# Patient Record
Sex: Male | Born: 1990 | Race: White | Hispanic: No | Marital: Single | Smoking: Never smoker
Health system: Southern US, Community
[De-identification: ages and names within clinical notes are randomized; demographics above are authoritative.]

## PROBLEM LIST (undated history)

## (undated) DIAGNOSIS — R011 Cardiac murmur, unspecified: Secondary | ICD-10-CM

---

## 2012-05-18 ENCOUNTER — Emergency Department (HOSPITAL_COMMUNITY)
Admission: EM | Admit: 2012-05-18 | Discharge: 2012-05-18 | Disposition: A | Discharge status: Home / Self Care | Payer: Self-pay | Attending: Emergency Medicine | Admitting: Emergency Medicine

## 2012-05-18 ENCOUNTER — Encounter (HOSPITAL_COMMUNITY): Payer: Self-pay | Admitting: Emergency Medicine

## 2012-05-18 DIAGNOSIS — R011 Cardiac murmur, unspecified: Secondary | ICD-10-CM | POA: Insufficient documentation

## 2012-05-18 DIAGNOSIS — M533 Sacrococcygeal disorders, not elsewhere classified: Secondary | ICD-10-CM | POA: Insufficient documentation

## 2012-05-18 HISTORY — DX: Cardiac murmur, unspecified: R01.1

## 2012-05-18 MED ORDER — NAPROXEN 500 MG PO TABS: 500.0000 mg | ORAL_TABLET | Freq: Two times a day (BID) | ORAL | Status: AC

## 2012-05-18 MED ORDER — IBUPROFEN 400 MG PO TABS
400.0000 mg | ORAL_TABLET | Freq: Once | ORAL | Status: AC
  Administered 2012-05-18: 400 mg via ORAL
  Filled 2012-05-18: qty 1

## 2012-05-18 NOTE — ED Notes (Signed)
Pt c/o tailbone pain x 2 days; pt denies any obvious injury

## 2012-05-18 NOTE — ED Provider Notes (Signed)
History    This chart was scribed for Ronnie Razor, MD, MD by Smitty Pluck. The patient was seen in room TR09C and the patient's care was started at 4:44PM.   CSN: [id number]  Arrival date & time 05/18/12  1526   First MD Initiated Contact with Patient 05/18/12 1622      Chief Complaint  Patient presents with  . Tailbone Pain    (Consider location/radiation/quality/duration/timing/severity/associated sxs/prior treatment) The history is provided by the patient.   Ronnie Rodriguez is a 21 y.o. male who presents to the Emergency Department complaining of moderate, constant, sharp tailbone pain onset 1 day ago. Pt reports sudden onset when he was walking. Denies any injury. Pt denies radiation. Pt denies incontinence, changes in bowel movements, dizziness, abdominal pain and diarrhea. Pt reports that he has not taken any medication prior to arrival. Pt reports that he has heart murmurs.    Past Medical History  Diagnosis Date  . Heart murmur     History reviewed. No pertinent past surgical history.  History reviewed. No pertinent family history.  History  Substance Use Topics  . Smoking status: Never Smoker   . Smokeless tobacco: Not on file  . Alcohol Use: No      Review of Systems  All other systems reviewed and are negative.   10 Systems reviewed and all are negative for acute change except as noted in the HPI.   Allergies  Review of patient's allergies indicates no known allergies.  Home Medications  No current outpatient prescriptions on file.  BP 136/65  Pulse 79  Temp 98.4 F (36.9 C) (Oral)  Resp 20  SpO2 97%  Physical Exam  Nursing note and vitals reviewed. Constitutional: He appears well-developed and well-nourished. No distress.  HENT:  Head: Normocephalic and atraumatic.  Eyes: Conjunctivae are normal. Right eye exhibits no discharge. Left eye exhibits no discharge.  Neck: Neck supple.  Cardiovascular: Normal rate, regular rhythm and normal heart  sounds.  Exam reveals no gallop and no friction rub.   No murmur heard. Pulmonary/Chest: Effort normal and breath sounds normal. No respiratory distress.  Musculoskeletal: He exhibits no edema.       Tenderness at midline coccyx No overlying skin changes No pylonidal abscess   Neurological: He is alert.  Skin: Skin is warm and dry.  Psychiatric: He has a normal mood and affect. His behavior is normal. Thought content normal.    ED Course  Procedures (including critical care time) DIAGNOSTIC STUDIES: Oxygen Saturation is 97% on room air, normal by my interpretation.    COORDINATION OF CARE: 4:49PM EDP discusses pt ED treatment with pt     Labs Reviewed - No data to display No results found.   1. Coccyx pain       MDM  20yM with atraumatic coccyx pain. No external signs of infection. Consider deep space infection but doubt. No hx of trauma. Nonfocal neuro exam. Low suspicion for emergent etiology at this time. Pan symptomatic tx. Return precautions discussed.  I personally preformed the services scribed in my presence. The recorded information has been reviewed and considered. Ronnie Razor, MD.       Ronnie Razor, MD 05/27/12 [phone]

## 2015-07-22 ENCOUNTER — Inpatient Hospital Stay (HOSPITAL_COMMUNITY)
Admission: EM | Admit: 2015-07-22 | Discharge: 2015-07-28 | DRG: 481 | Disposition: A | Discharge status: Home / Self Care | Payer: Self-pay | Attending: Orthopaedic Surgery | Admitting: Orthopaedic Surgery

## 2015-07-22 DIAGNOSIS — E871 Hypo-osmolality and hyponatremia: Secondary | ICD-10-CM | POA: Diagnosis present

## 2015-07-22 DIAGNOSIS — Z419 Encounter for procedure for purposes other than remedying health state, unspecified: Secondary | ICD-10-CM

## 2015-07-22 DIAGNOSIS — D62 Acute posthemorrhagic anemia: Secondary | ICD-10-CM | POA: Diagnosis not present

## 2015-07-22 DIAGNOSIS — Z01818 Encounter for other preprocedural examination: Secondary | ICD-10-CM

## 2015-07-22 DIAGNOSIS — M7989 Other specified soft tissue disorders: Secondary | ICD-10-CM

## 2015-07-22 DIAGNOSIS — S72001A Fracture of unspecified part of neck of right femur, initial encounter for closed fracture: Secondary | ICD-10-CM | POA: Diagnosis present

## 2015-07-22 DIAGNOSIS — Z880 Allergy status to penicillin: Secondary | ICD-10-CM

## 2015-07-22 DIAGNOSIS — S72141A Displaced intertrochanteric fracture of right femur, initial encounter for closed fracture: Principal | ICD-10-CM | POA: Diagnosis present

## 2015-07-22 DIAGNOSIS — S72009A Fracture of unspecified part of neck of unspecified femur, initial encounter for closed fracture: Secondary | ICD-10-CM | POA: Diagnosis present

## 2015-07-22 DIAGNOSIS — W1830XA Fall on same level, unspecified, initial encounter: Secondary | ICD-10-CM | POA: Diagnosis present

## 2015-07-22 DIAGNOSIS — Z590 Homelessness: Secondary | ICD-10-CM
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
## 2015-07-23 ENCOUNTER — Emergency Department (HOSPITAL_COMMUNITY): Payer: Self-pay

## 2015-07-23 ENCOUNTER — Inpatient Hospital Stay (HOSPITAL_COMMUNITY): Payer: Self-pay | Admitting: Anesthesiology

## 2015-07-23 ENCOUNTER — Inpatient Hospital Stay (HOSPITAL_COMMUNITY): Payer: MEDICAID

## 2015-07-23 ENCOUNTER — Inpatient Hospital Stay (HOSPITAL_COMMUNITY): Payer: MEDICAID | Admitting: Anesthesiology

## 2015-07-23 ENCOUNTER — Inpatient Hospital Stay (HOSPITAL_COMMUNITY): Payer: Self-pay

## 2015-07-23 ENCOUNTER — Encounter (HOSPITAL_COMMUNITY): Payer: Self-pay

## 2015-07-23 ENCOUNTER — Encounter (HOSPITAL_COMMUNITY)
Admission: EM | Disposition: A | Discharge status: Home / Self Care | Payer: Self-pay | Source: Home / Self Care | Attending: Orthopaedic Surgery

## 2015-07-23 DIAGNOSIS — S72001A Fracture of unspecified part of neck of right femur, initial encounter for closed fracture: Secondary | ICD-10-CM | POA: Diagnosis present

## 2015-07-23 DIAGNOSIS — S72009A Fracture of unspecified part of neck of unspecified femur, initial encounter for closed fracture: Secondary | ICD-10-CM | POA: Diagnosis present

## 2015-07-23 HISTORY — PX: FEMUR IM NAIL: SHX1597

## 2015-07-23 LAB — PROTIME-INR
INR: 1.33 (ref 0.00–1.49)
Prothrombin Time: 16.7 seconds — ABNORMAL HIGH (ref 11.6–15.2)

## 2015-07-23 LAB — CBC WITH DIFFERENTIAL/PLATELET
BASOS ABS: 0 10*3/uL (ref 0.0–0.1)
BASOS PCT: 0 %
EOS ABS: 0 10*3/uL (ref 0.0–0.7)
EOS PCT: 1 %
HCT: 40.5 % (ref 39.0–52.0)
Hemoglobin: 13.7 g/dL (ref 13.0–17.0)
LYMPHS PCT: 32 %
Lymphs Abs: 2.2 10*3/uL (ref 0.7–4.0)
MCH: 28.8 pg (ref 26.0–34.0)
MCHC: 33.8 g/dL (ref 30.0–36.0)
MCV: 85.1 fL (ref 78.0–100.0)
Monocytes Absolute: 0.4 10*3/uL (ref 0.1–1.0)
Monocytes Relative: 6 %
Neutro Abs: 4.3 10*3/uL (ref 1.7–7.7)
Neutrophils Relative %: 61 %
PLATELETS: 165 10*3/uL (ref 150–400)
RBC: 4.76 MIL/uL (ref 4.22–5.81)
RDW: 13.1 % (ref 11.5–15.5)
WBC: 6.9 10*3/uL (ref 4.0–10.5)

## 2015-07-23 LAB — COMPREHENSIVE METABOLIC PANEL
ALT: 16 U/L — AB (ref 17–63)
ANION GAP: 9 (ref 5–15)
AST: 26 U/L (ref 15–41)
Albumin: 4.2 g/dL (ref 3.5–5.0)
Alkaline Phosphatase: 61 U/L (ref 38–126)
BUN: 9 mg/dL (ref 6–20)
CHLORIDE: 104 mmol/L (ref 101–111)
CO2: 26 mmol/L (ref 22–32)
Calcium: 8.9 mg/dL (ref 8.9–10.3)
Creatinine, Ser: 1.04 mg/dL (ref 0.61–1.24)
GFR calc non Af Amer: >60 mL/min (ref >60)
Glucose, Bld: 112 mg/dL — ABNORMAL HIGH (ref 65–99)
Potassium: 3.5 mmol/L (ref 3.5–5.1)
SODIUM: 139 mmol/L (ref 135–145)
Total Bilirubin: 1.1 mg/dL (ref 0.3–1.2)
Total Protein: 6.8 g/dL (ref 6.5–8.1)

## 2015-07-23 LAB — SURGICAL PCR SCREEN
MRSA, PCR: NEGATIVE
Staphylococcus aureus: NEGATIVE

## 2015-07-23 LAB — ABO/RH: ABO/RH(D): O POS

## 2015-07-23 LAB — APTT: aPTT: 29 seconds (ref 24–37)

## 2015-07-23 SURGERY — INSERTION, INTRAMEDULLARY ROD, FEMUR
Anesthesia: General | Site: Hip | Laterality: Right

## 2015-07-23 MED ORDER — HYDROCODONE-ACETAMINOPHEN 5-325 MG PO TABS
1.0000 | ORAL_TABLET | Freq: Four times a day (QID) | ORAL | Status: DC | PRN
  Administered 2015-07-23 – 2015-07-27 (×6): 2 via ORAL
  Filled 2015-07-23 (×4): qty 2

## 2015-07-23 MED ORDER — LIDOCAINE HCL (CARDIAC) 20 MG/ML IV SOLN
INTRAVENOUS | Status: DC | PRN
Start: 2015-07-23 — End: 2015-07-23
  Administered 2015-07-23: 80 mg via INTRAVENOUS

## 2015-07-23 MED ORDER — OXYCODONE HCL 5 MG/5ML PO SOLN: 5.0000 mg | Freq: Once | ORAL | Status: AC | PRN

## 2015-07-23 MED ORDER — SODIUM CHLORIDE 0.9 % IV SOLN
INTRAVENOUS | Status: AC | PRN
  Administered 2015-07-23 (×2): 1000 mL via INTRAVENOUS

## 2015-07-23 MED ORDER — MORPHINE SULFATE 2 MG/ML IJ SOLN
INTRAMUSCULAR | Status: AC | PRN
  Administered 2015-07-23: 4 mg via INTRAVENOUS

## 2015-07-23 MED ORDER — ACETAMINOPHEN 325 MG PO TABS
650.0000 mg | ORAL_TABLET | Freq: Four times a day (QID) | ORAL | Status: DC | PRN
  Administered 2015-07-24 – 2015-07-26 (×2): 650 mg via ORAL
  Filled 2015-07-23 (×3): qty 2

## 2015-07-23 MED ORDER — 0.9 % SODIUM CHLORIDE (POUR BTL) OPTIME
TOPICAL | Status: DC | PRN
  Administered 2015-07-23: 1000 mL

## 2015-07-23 MED ORDER — SODIUM CHLORIDE 0.9 % IV SOLN: INTRAVENOUS | Status: AC

## 2015-07-23 MED ORDER — ACETAMINOPHEN 650 MG RE SUPP: 650.0000 mg | Freq: Four times a day (QID) | RECTAL | Status: DC | PRN

## 2015-07-23 MED ORDER — HYDROCODONE-ACETAMINOPHEN 5-325 MG PO TABS
1.0000 | ORAL_TABLET | Freq: Four times a day (QID) | ORAL | Status: DC | PRN
  Administered 2015-07-23: 2 via ORAL
  Filled 2015-07-23 (×3): qty 2

## 2015-07-23 MED ORDER — FENTANYL CITRATE (PF) 250 MCG/5ML IJ SOLN
INTRAMUSCULAR | Status: AC
  Filled 2015-07-23: qty 5

## 2015-07-23 MED ORDER — FENTANYL CITRATE (PF) 100 MCG/2ML IJ SOLN
INTRAMUSCULAR | Status: DC | PRN
  Administered 2015-07-23 (×2): 100 ug via INTRAVENOUS
  Administered 2015-07-23 (×4): 50 ug via INTRAVENOUS
  Administered 2015-07-23: 100 ug via INTRAVENOUS

## 2015-07-23 MED ORDER — ONDANSETRON HCL 4 MG/2ML IJ SOLN
INTRAMUSCULAR | Status: AC
  Filled 2015-07-23: qty 2

## 2015-07-23 MED ORDER — HYDROMORPHONE HCL 1 MG/ML IJ SOLN
1.0000 mg | INTRAMUSCULAR | Status: AC | PRN
  Administered 2015-07-23 (×4): 1 mg via INTRAVENOUS
  Filled 2015-07-23 (×4): qty 1

## 2015-07-23 MED ORDER — OXYCODONE HCL 5 MG PO TABS
5.0000 mg | ORAL_TABLET | Freq: Once | ORAL | Status: AC | PRN
  Administered 2015-07-23: 5 mg via ORAL

## 2015-07-23 MED ORDER — PHENOL 1.4 % MT LIQD
1.0000 | OROMUCOSAL | Status: DC | PRN
Start: 2015-07-23 — End: 2015-07-28

## 2015-07-23 MED ORDER — METOCLOPRAMIDE HCL 5 MG PO TABS: 5.0000 mg | ORAL_TABLET | Freq: Three times a day (TID) | ORAL | Status: DC | PRN

## 2015-07-23 MED ORDER — GLYCOPYRROLATE 0.2 MG/ML IJ SOLN
INTRAMUSCULAR | Status: DC | PRN
  Administered 2015-07-23: 0.6 mg via INTRAVENOUS

## 2015-07-23 MED ORDER — OXYCODONE HCL 5 MG PO TABS
5.0000 mg | ORAL_TABLET | ORAL | Status: DC | PRN
  Administered 2015-07-23 – 2015-07-28 (×10): 10 mg via ORAL
  Filled 2015-07-23 (×21): qty 2

## 2015-07-23 MED ORDER — MORPHINE SULFATE (PF) 2 MG/ML IV SOLN
0.5000 mg | INTRAVENOUS | Status: DC | PRN
  Administered 2015-07-24 (×3): 0.5 mg via INTRAVENOUS
  Filled 2015-07-23 (×9): qty 1

## 2015-07-23 MED ORDER — FENTANYL CITRATE (PF) 100 MCG/2ML IJ SOLN
25.0000 ug | INTRAMUSCULAR | Status: DC | PRN
  Administered 2015-07-23 (×3): 50 ug via INTRAVENOUS

## 2015-07-23 MED ORDER — ONDANSETRON HCL 4 MG/2ML IJ SOLN: 4.0000 mg | Freq: Three times a day (TID) | INTRAMUSCULAR | Status: AC | PRN

## 2015-07-23 MED ORDER — MENTHOL 3 MG MT LOZG
1.0000 | LOZENGE | OROMUCOSAL | Status: DC | PRN
  Filled 2015-07-23: qty 9

## 2015-07-23 MED ORDER — ROCURONIUM BROMIDE 50 MG/5ML IV SOLN
INTRAVENOUS | Status: AC
  Filled 2015-07-23: qty 1

## 2015-07-23 MED ORDER — LACTATED RINGERS IV SOLN
INTRAVENOUS | Status: DC
  Administered 2015-07-23 (×3): via INTRAVENOUS

## 2015-07-23 MED ORDER — OXYCODONE HCL 5 MG PO TABS: 5.0000 mg | ORAL_TABLET | ORAL | Status: AC | PRN

## 2015-07-23 MED ORDER — OXYCODONE HCL ER 10 MG PO T12A: 10.0000 mg | EXTENDED_RELEASE_TABLET | Freq: Two times a day (BID) | ORAL | Status: AC

## 2015-07-23 MED ORDER — SORBITOL 70 % SOLN: 30.0000 mL | Freq: Every day | Status: DC | PRN

## 2015-07-23 MED ORDER — OXYCODONE HCL 5 MG PO TABS
5.0000 mg | ORAL_TABLET | ORAL | Status: DC | PRN
  Administered 2015-07-23 – 2015-07-27 (×18): 10 mg via ORAL
  Filled 2015-07-23 (×7): qty 2

## 2015-07-23 MED ORDER — ASPIRIN EC 325 MG PO TBEC
325.0000 mg | DELAYED_RELEASE_TABLET | Freq: Two times a day (BID) | ORAL | Status: DC
  Administered 2015-07-24 – 2015-07-27 (×8): 325 mg via ORAL
  Filled 2015-07-23 (×9): qty 1

## 2015-07-23 MED ORDER — MORPHINE SULFATE (PF) 2 MG/ML IV SOLN
0.5000 mg | INTRAVENOUS | Status: DC | PRN
  Administered 2015-07-25 – 2015-07-27 (×9): 0.5 mg via INTRAVENOUS
  Filled 2015-07-23 (×4): qty 1

## 2015-07-23 MED ORDER — LIDOCAINE HCL (CARDIAC) 20 MG/ML IV SOLN
INTRAVENOUS | Status: AC
  Filled 2015-07-23: qty 5

## 2015-07-23 MED ORDER — OXYCODONE HCL 5 MG PO TABS
ORAL_TABLET | ORAL | Status: AC
  Filled 2015-07-23: qty 1

## 2015-07-23 MED ORDER — MIDAZOLAM HCL 5 MG/5ML IJ SOLN
INTRAMUSCULAR | Status: DC | PRN
  Administered 2015-07-23: 2 mg via INTRAVENOUS

## 2015-07-23 MED ORDER — ONDANSETRON HCL 4 MG PO TABS
4.0000 mg | ORAL_TABLET | Freq: Four times a day (QID) | ORAL | Status: DC | PRN
  Administered 2015-07-24: 4 mg via ORAL
  Filled 2015-07-23: qty 1

## 2015-07-23 MED ORDER — METHOCARBAMOL 500 MG PO TABS
500.0000 mg | ORAL_TABLET | Freq: Four times a day (QID) | ORAL | Status: DC | PRN
Start: 2015-07-23 — End: 2015-07-28
  Administered 2015-07-23 – 2015-07-27 (×10): 500 mg via ORAL
  Filled 2015-07-23 (×11): qty 1

## 2015-07-23 MED ORDER — ONDANSETRON HCL 4 MG/2ML IJ SOLN
INTRAMUSCULAR | Status: DC | PRN
  Administered 2015-07-23: 4 mg via INTRAVENOUS

## 2015-07-23 MED ORDER — MORPHINE SULFATE (PF) 4 MG/ML IV SOLN: 4.0000 mg | Freq: Once | INTRAVENOUS | Status: AC

## 2015-07-23 MED ORDER — VANCOMYCIN HCL IN DEXTROSE 1-5 GM/200ML-% IV SOLN
1000.0000 mg | Freq: Two times a day (BID) | INTRAVENOUS | Status: AC
  Administered 2015-07-24 (×2): 1000 mg via INTRAVENOUS
  Filled 2015-07-23 (×3): qty 200

## 2015-07-23 MED ORDER — ASPIRIN EC 325 MG PO TBEC: 325.0000 mg | DELAYED_RELEASE_TABLET | Freq: Two times a day (BID) | ORAL | Status: AC

## 2015-07-23 MED ORDER — CHLORHEXIDINE GLUCONATE 4 % EX LIQD: 60.0000 mL | Freq: Once | CUTANEOUS | Status: DC

## 2015-07-23 MED ORDER — ONDANSETRON HCL 4 MG/2ML IJ SOLN
4.0000 mg | Freq: Four times a day (QID) | INTRAMUSCULAR | Status: DC | PRN
  Administered 2015-07-25: 4 mg via INTRAVENOUS
  Filled 2015-07-23: qty 2

## 2015-07-23 MED ORDER — PHENYLEPHRINE HCL 10 MG/ML IJ SOLN
INTRAMUSCULAR | Status: DC | PRN
  Administered 2015-07-23 (×3): 80 ug via INTRAVENOUS

## 2015-07-23 MED ORDER — POLYETHYLENE GLYCOL 3350 17 G PO PACK: 17.0000 g | PACK | Freq: Every day | ORAL | Status: DC | PRN

## 2015-07-23 MED ORDER — PROMETHAZINE HCL 25 MG/ML IJ SOLN
INTRAMUSCULAR | Status: AC
  Filled 2015-07-23: qty 1

## 2015-07-23 MED ORDER — NEOSTIGMINE METHYLSULFATE 10 MG/10ML IV SOLN
INTRAVENOUS | Status: DC | PRN
  Administered 2015-07-23: 4 mg via INTRAVENOUS

## 2015-07-23 MED ORDER — SODIUM CHLORIDE 0.9 % IV SOLN
INTRAVENOUS | Status: DC
Start: 2015-07-23 — End: 2015-07-23

## 2015-07-23 MED ORDER — SODIUM CHLORIDE 0.9 % IV SOLN: INTRAVENOUS | Status: DC

## 2015-07-23 MED ORDER — ROCURONIUM BROMIDE 100 MG/10ML IV SOLN
INTRAVENOUS | Status: DC | PRN
Start: 2015-07-23 — End: 2015-07-23
  Administered 2015-07-23: 50 mg via INTRAVENOUS
  Administered 2015-07-23: 20 mg via INTRAVENOUS

## 2015-07-23 MED ORDER — MORPHINE SULFATE (PF) 4 MG/ML IV SOLN
4.0000 mg | Freq: Once | INTRAVENOUS | Status: AC
  Administered 2015-07-23: 4 mg via INTRAVENOUS
  Filled 2015-07-23: qty 1

## 2015-07-23 MED ORDER — VANCOMYCIN HCL IN DEXTROSE 1-5 GM/200ML-% IV SOLN
INTRAVENOUS | Status: AC
  Filled 2015-07-23: qty 200

## 2015-07-23 MED ORDER — FENTANYL CITRATE (PF) 100 MCG/2ML IJ SOLN
INTRAMUSCULAR | Status: AC
  Filled 2015-07-23: qty 2

## 2015-07-23 MED ORDER — SODIUM CHLORIDE 0.9 % IV SOLN
INTRAVENOUS | Status: DC
  Administered 2015-07-23 – 2015-07-24 (×3): via INTRAVENOUS

## 2015-07-23 MED ORDER — PROPOFOL 10 MG/ML IV BOLUS
INTRAVENOUS | Status: AC
  Filled 2015-07-23: qty 20

## 2015-07-23 MED ORDER — SODIUM CHLORIDE 0.9 % IV SOLN
INTRAVENOUS | Status: DC | PRN
  Administered 2015-07-23: 75 mL/h via INTRAVENOUS

## 2015-07-23 MED ORDER — MIDAZOLAM HCL 2 MG/2ML IJ SOLN
INTRAMUSCULAR | Status: AC
  Filled 2015-07-23: qty 4

## 2015-07-23 MED ORDER — PROPOFOL 10 MG/ML IV BOLUS
INTRAVENOUS | Status: DC | PRN
  Administered 2015-07-23: 200 mg via INTRAVENOUS

## 2015-07-23 MED ORDER — METHOCARBAMOL 1000 MG/10ML IJ SOLN
500.0000 mg | Freq: Four times a day (QID) | INTRAMUSCULAR | Status: DC | PRN
  Filled 2015-07-23: qty 5

## 2015-07-23 MED ORDER — VANCOMYCIN HCL IN DEXTROSE 1-5 GM/200ML-% IV SOLN
1000.0000 mg | INTRAVENOUS | Status: AC
  Administered 2015-07-23: 1000 mg via INTRAVENOUS

## 2015-07-23 MED ORDER — METOCLOPRAMIDE HCL 5 MG/ML IJ SOLN: 5.0000 mg | Freq: Three times a day (TID) | INTRAMUSCULAR | Status: DC | PRN

## 2015-07-23 MED ORDER — ALUM & MAG HYDROXIDE-SIMETH 200-200-20 MG/5ML PO SUSP: 30.0000 mL | ORAL | Status: DC | PRN

## 2015-07-23 MED ORDER — PROMETHAZINE HCL 25 MG/ML IJ SOLN
6.2500 mg | INTRAMUSCULAR | Status: DC | PRN
  Administered 2015-07-23: 12.5 mg via INTRAVENOUS

## 2015-07-23 MED ORDER — MORPHINE SULFATE (PF) 2 MG/ML IV SOLN
INTRAVENOUS | Status: AC
  Filled 2015-07-23: qty 2

## 2015-07-23 SURGICAL SUPPLY — 44 items
BIT DRILL SHORT 4.0 (BIT) ×2 IMPLANT
BNDG COHESIVE 4X5 TAN STRL (GAUZE/BANDAGES/DRESSINGS) ×3 IMPLANT
COVER PERINEAL POST (MISCELLANEOUS) ×3 IMPLANT
COVER SURGICAL LIGHT HANDLE (MISCELLANEOUS) ×3 IMPLANT
DRAPE C-ARM 42X72 X-RAY (DRAPES) IMPLANT
DRAPE C-ARMOR (DRAPES) IMPLANT
DRAPE INCISE IOBAN 66X45 STRL (DRAPES) IMPLANT
DRAPE ORTHO SPLIT 77X108 STRL (DRAPES)
DRAPE PROXIMA HALF (DRAPES) IMPLANT
DRAPE SURG ORHT 6 SPLT 77X108 (DRAPES) IMPLANT
DRAPE U-SHAPE 47X51 STRL (DRAPES) IMPLANT
DRILL BIT SHORT 4.0 (BIT) ×4
DRSG EMULSION OIL 3X3 NADH (GAUZE/BANDAGES/DRESSINGS) IMPLANT
DRSG MEPILEX BORDER 4X4 (GAUZE/BANDAGES/DRESSINGS) ×3 IMPLANT
DRSG MEPILEX BORDER 4X8 (GAUZE/BANDAGES/DRESSINGS) ×3 IMPLANT
DURAPREP 26ML APPLICATOR (WOUND CARE) ×3 IMPLANT
ELECT CAUTERY BLADE 6.4 (BLADE) ×3 IMPLANT
ELECT REM PT RETURN 9FT ADLT (ELECTROSURGICAL) ×3
ELECTRODE REM PT RTRN 9FT ADLT (ELECTROSURGICAL) ×1 IMPLANT
FACESHIELD WRAPAROUND (MASK) ×6 IMPLANT
GLOVE NEODERM STRL 7.5 LF PF (GLOVE) ×2 IMPLANT
GLOVE SURG NEODERM 7.5  LF PF (GLOVE) ×4
GOWN STRL REIN XL XLG (GOWN DISPOSABLE) ×12 IMPLANT
GUIDE PIN 3.2X343 (PIN) ×1
GUIDE PIN 3.2X343MM (PIN) ×2
KIT BASIN OR (CUSTOM PROCEDURE TRAY) ×3 IMPLANT
KIT ROOM TURNOVER OR (KITS) ×3 IMPLANT
LINER BOOT UNIVERSAL DISP (MISCELLANEOUS) ×3 IMPLANT
MANIFOLD NEPTUNE II (INSTRUMENTS) ×3 IMPLANT
NAIL TRIGEN INTERTAN RT 10X40 (Nail) ×3 IMPLANT
NS IRRIG 1000ML POUR BTL (IV SOLUTION) ×3 IMPLANT
PACK GENERAL/GYN (CUSTOM PROCEDURE TRAY) ×3 IMPLANT
PAD ARMBOARD 7.5X6 YLW CONV (MISCELLANEOUS) ×6 IMPLANT
PIN GUIDE 3.2X343MM (PIN) ×1 IMPLANT
SCREW LAG COMPR KIT 105/100 (Screw) ×3 IMPLANT
SCREW TRIGEN LOW PROF 5.0X40 (Screw) ×3 IMPLANT
SCREW TRIGEN LOW PROF 5.0X42.5 (Screw) ×3 IMPLANT
STAPLER VISISTAT 35W (STAPLE) IMPLANT
STOCKINETTE IMPERVIOUS 9X36 MD (GAUZE/BANDAGES/DRESSINGS) IMPLANT
SUT VIC AB 0 CT1 27 (SUTURE) ×2
SUT VIC AB 0 CT1 27XBRD ANBCTR (SUTURE) ×1 IMPLANT
SUT VIC AB 2-0 CT1 27 (SUTURE) ×4
SUT VIC AB 2-0 CT1 TAPERPNT 27 (SUTURE) ×2 IMPLANT
WATER STERILE IRR 1000ML POUR (IV SOLUTION) ×6 IMPLANT

## 2015-07-23 NOTE — Anesthesia Procedure Notes (Signed)
Procedure Name: Intubation Date/Time: 07/23/2015 3:21 PM Performed by: Ferol Luz L Pre-anesthesia Checklist: Patient identified, Emergency Drugs available, Suction available, Patient being monitored and Timeout performed Patient Re-evaluated:Patient Re-evaluated prior to inductionOxygen Delivery Method: Circle system utilized Preoxygenation: Pre-oxygenation with 100% oxygen Intubation Type: IV induction and Cricoid Pressure applied Ventilation: Mask ventilation without difficulty Laryngoscope Size: Mac and 4 Grade View: Grade I Tube type: Oral Tube size: 8.0 mm Number of attempts: 1 Airway Equipment and Method: Stylet Placement Confirmation: ETT inserted through vocal cords under direct vision,  positive ETCO2 and breath sounds checked- equal and bilateral Secured at: 23 cm Tube secured with: Tape Dental Injury: Teeth and Oropharynx as per pre-operative assessment

## 2015-07-23 NOTE — Progress Notes (Signed)
Utilization review completed.  

## 2015-07-23 NOTE — ED Notes (Signed)
See narrator

## 2015-07-23 NOTE — Progress Notes (Signed)
   07/23/15 1237  Clinical Encounter Type  Visited With Patient;Health care provider  Visit Type Initial  Referral From Nurse    Chaplain responded to a request to visit with patient and patient's family. Patient's family was not in the room. Chaplain will try to follow up and support is available as needed.  Alda Ponder, Chaplain 07/23/2015 12:38 PM

## 2015-07-23 NOTE — ED Notes (Signed)
Patient transported to X-ray 

## 2015-07-23 NOTE — Anesthesia Postprocedure Evaluation (Signed)
  Anesthesia Post-op Note  Patient: Ronnie Rodriguez  Procedure(s) Performed: Procedure(s): INTRAMEDULLARY (IM) NAIL FEMORAL (Right)  Patient Location: PACU  Anesthesia Type:General  Level of Consciousness: awake, alert  and oriented  Airway and Oxygen Therapy: Patient Spontanous Breathing and Patient connected to nasal cannula oxygen  Post-op Pain: mild  Post-op Assessment: Post-op Vital signs reviewed, Patient's Cardiovascular Status Stable, Respiratory Function Stable, Patent Airway and Pain level controlled              Post-op Vital Signs: stable  Last Vitals:  Filed Vitals:   07/23/15 1739  BP: 159/76  Pulse: 87  Temp: 37.3 C  Resp: 11    Complications: No apparent anesthesia complications

## 2015-07-23 NOTE — Progress Notes (Signed)
Report called  

## 2015-07-23 NOTE — Transfer of Care (Signed)
Immediate Anesthesia Transfer of Care Note  Patient: Ronnie Rodriguez  Procedure(s) Performed: Procedure(s): INTRAMEDULLARY (IM) NAIL FEMORAL (Right)  Patient Location: PACU  Anesthesia Type:General  Level of Consciousness: awake, alert , oriented and patient cooperative  Airway & Oxygen Therapy: Patient Spontanous Breathing and Patient connected to nasal cannula oxygen  Post-op Assessment: Report given to RN and Post -op Vital signs reviewed and stable  Post vital signs: Reviewed and stable  Last Vitals:  BP 156/80 HR 68 RR 12 SpO2 100 on 2L [place]  Complications: No apparent anesthesia complications

## 2015-07-23 NOTE — ED Notes (Signed)
Rhythm Strip placed on chart, pt taken to floor on return from xray and EKG not performed, no arrhthmia  Noted in ed, floor rn made aware of need for ekg.
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
## 2015-07-23 NOTE — Discharge Instructions (Signed)
    1. Change dressings as needed 2. May shower but keep incisions covered and dry 3. Take aspirin to prevent blood clots 4. Take stool softeners as needed 5. Take pain meds as needed  
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
## 2015-07-24 ENCOUNTER — Encounter (HOSPITAL_COMMUNITY): Payer: Self-pay | Admitting: Orthopaedic Surgery

## 2015-07-24 ENCOUNTER — Inpatient Hospital Stay (HOSPITAL_COMMUNITY): Payer: Self-pay

## 2015-07-24 LAB — CBC
HEMATOCRIT: 30 % — AB (ref 39.0–52.0)
Hemoglobin: 10.4 g/dL — ABNORMAL LOW (ref 13.0–17.0)
MCH: 29.5 pg (ref 26.0–34.0)
MCHC: 34.7 g/dL (ref 30.0–36.0)
MCV: 85.2 fL (ref 78.0–100.0)
Platelets: 152 10*3/uL (ref 150–400)
RBC: 3.52 MIL/uL — AB (ref 4.22–5.81)
RDW: 13.2 % (ref 11.5–15.5)
WBC: 10.4 10*3/uL (ref 4.0–10.5)

## 2015-07-24 LAB — BASIC METABOLIC PANEL
ANION GAP: 4 — AB (ref 5–15)
BUN: 5 mg/dL — ABNORMAL LOW (ref 6–20)
CO2: 26 mmol/L (ref 22–32)
Calcium: 8.1 mg/dL — ABNORMAL LOW (ref 8.9–10.3)
Chloride: 104 mmol/L (ref 101–111)
Creatinine, Ser: 1.01 mg/dL (ref 0.61–1.24)
GFR calc Af Amer: >60 mL/min (ref >60)
GLUCOSE: 105 mg/dL — AB (ref 65–99)
POTASSIUM: 4 mmol/L (ref 3.5–5.1)
Sodium: 134 mmol/L — ABNORMAL LOW (ref 135–145)

## 2015-07-24 LAB — VITAMIN D 25 HYDROXY (VIT D DEFICIENCY, FRACTURES): VIT D 25 HYDROXY: 24.9 ng/mL — AB (ref 30.0–100.0)

## 2015-07-24 MED ORDER — INFLUENZA VAC SPLIT QUAD 0.5 ML IM SUSY
0.5000 mL | PREFILLED_SYRINGE | INTRAMUSCULAR | Status: DC
  Filled 2015-07-24: qty 0.5

## 2015-07-24 MED ORDER — DIPHENHYDRAMINE HCL 25 MG PO CAPS
25.0000 mg | ORAL_CAPSULE | Freq: Four times a day (QID) | ORAL | Status: DC | PRN
  Administered 2015-07-24: 25 mg via ORAL
  Filled 2015-07-24: qty 1

## 2015-07-24 MED ORDER — LORAZEPAM 1 MG PO TABS
1.0000 mg | ORAL_TABLET | Freq: Four times a day (QID) | ORAL | Status: DC | PRN
  Filled 2015-07-24 (×2): qty 1

## 2015-07-24 MED ORDER — IBUPROFEN 200 MG PO TABS
800.0000 mg | ORAL_TABLET | Freq: Once | ORAL | Status: AC
  Administered 2015-07-24: 800 mg via ORAL
  Filled 2015-07-24: qty 4
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
## 2015-07-24 NOTE — Progress Notes (Signed)
   Subjective:  Patient reports pain as severe when moved.  Objective:   VITALS:   Filed Vitals:   07/23/15 1739 07/23/15 2100 07/24/15 0037 07/24/15 0607  BP: 159/76 126/64 143/72 135/70  Pulse: 87 103 100 93  Temp: 99.1 F (37.3 C) 100.3 F (37.9 C) 98.9 F (37.2 C) 99.8 F (37.7 C)  TempSrc:  Oral Oral Oral  Resp: Height:      Weight:      SpO2: 100% 96% 100% 98%    Neurologically intact Neurovascular intact Sensation intact distally Intact pulses distally Dorsiflexion/Plantar flexion intact Incision: dressing C/D/I and no drainage No cellulitis present Compartment soft   Lab Results  Component Value Date   WBC 10.4 07/24/2015   HGB 10.4* 07/24/2015   HCT 30.0* 07/24/2015   MCV 85.2 07/24/2015   PLT 152 07/24/2015     Assessment/Plan:  1 Day Post-Op   - Expected postop acute blood loss anemia - will monitor for symptoms - Up with PT/OT - DVT ppx - SCDs, ambulation, aspirin - WBAT operative extremity - Pain control - Discharge planning - possibly tomorrow   Cheral Almas 07/24/2015, 7:50 AM [phone]

## 2015-07-24 NOTE — Progress Notes (Signed)
Mepilex dressing to patient's right hip is stained.  I have attempted multiple times to change dressing.  Patient refuses b/c he does not want to move.  I have medicated patient with pain meds and offered to change dressing once he is more comfortable, he still refused.   His right hand is swollen.  Suggested patient get ice pack and elevate right hand.  Patient refuses at this time.

## 2015-07-25 DIAGNOSIS — S72001A Fracture of unspecified part of neck of right femur, initial encounter for closed fracture: Secondary | ICD-10-CM

## 2015-07-25 LAB — CBC
HCT: 23.8 % — ABNORMAL LOW (ref 39.0–52.0)
Hemoglobin: 8.2 g/dL — ABNORMAL LOW (ref 13.0–17.0)
MCH: 29.1 pg (ref 26.0–34.0)
MCHC: 34.5 g/dL (ref 30.0–36.0)
MCV: 84.4 fL (ref 78.0–100.0)
PLATELETS: 122 10*3/uL — AB (ref 150–400)
RBC: 2.82 MIL/uL — AB (ref 4.22–5.81)
RDW: 13 % (ref 11.5–15.5)
WBC: 6.4 10*3/uL (ref 4.0–10.5)

## 2015-07-25 LAB — BASIC METABOLIC PANEL
Anion gap: 6 (ref 5–15)
BUN: 8 mg/dL (ref 6–20)
CALCIUM: 7.9 mg/dL — AB (ref 8.9–10.3)
CO2: 27 mmol/L (ref 22–32)
CREATININE: 0.86 mg/dL (ref 0.61–1.24)
Chloride: 101 mmol/L (ref 101–111)
GFR calc Af Amer: >60 mL/min (ref >60)
GLUCOSE: 104 mg/dL — AB (ref 65–99)
POTASSIUM: 3.8 mmol/L (ref 3.5–5.1)
SODIUM: 134 mmol/L — AB (ref 135–145)
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
## 2015-07-25 NOTE — Progress Notes (Signed)
Rehab Admissions Coordinator Note:  Patient was screened by Trish Mage for appropriateness for an Inpatient Acute Rehab Consult.  At this time, an inpatient rehab consult has been ordered and is pending.  We will follow up once consult is completed.   Lelon Frohlich M 07/25/2015, 8:12 AM  I can be reached at [phone].
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
## 2015-07-25 NOTE — Progress Notes (Signed)
Orthopedic Tech Progress Note Patient Details:  Ronnie Rodriguez 1991-01-01 [id number]  Patient ID: Ronnie Rodriguez, male   DOB: November 10, 1990, 24 y.o.   MRN: [id number] Called in advanced brace order; spoke with Ferdinand Lango, Beauty Pless 07/25/2015, 11:18 AM

## 2015-07-25 NOTE — Progress Notes (Signed)
Orthopedic Tech Progress Note Patient Details:  Ronnie Rodriguez 1991-03-24 [id number] Advanced called to place brace order Patient ID: Ronnie Rodriguez, male   DOB: May 30, 1991, 24 y.o.   MRN: [id number]   [name] 07/25/2015, 9:21 AM
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
## 2015-07-26 LAB — CBC
HCT: 22.7 % — ABNORMAL LOW (ref 39.0–52.0)
Hemoglobin: 7.6 g/dL — ABNORMAL LOW (ref 13.0–17.0)
MCH: 28.5 pg (ref 26.0–34.0)
MCHC: 33.5 g/dL (ref 30.0–36.0)
MCV: 85 fL (ref 78.0–100.0)
PLATELETS: 137 10*3/uL — AB (ref 150–400)
RBC: 2.67 MIL/uL — ABNORMAL LOW (ref 4.22–5.81)
RDW: 13.4 % (ref 11.5–15.5)
WBC: 6 10*3/uL (ref 4.0–10.5)

## 2015-07-26 LAB — PREPARE RBC (CROSSMATCH)

## 2015-07-26 MED ORDER — SODIUM CHLORIDE 0.9 % IV SOLN: Freq: Once | INTRAVENOUS | Status: DC
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
## 2015-07-26 NOTE — Progress Notes (Signed)
Pt hgb 7.6 to receive 1 unit prbc's

## 2015-07-26 NOTE — Progress Notes (Signed)
Orthopedic Tech Progress Note Patient Details:  Ronnie Rodriguez 12-Dec-1990 [id number] Brace order completed by [employer]. Patient ID: Ronnie Roseboom, male   DOB: 18-May-1991, 24 y.o.   MRN: [id number]   Jennye Moccasin 07/26/2015, 3:14 PM

## 2015-07-26 NOTE — Progress Notes (Signed)
Rehab admissions - Evaluated for possible admission.  I met with patient who reports that he is homeless, lived in a tent close to [place], walks 2-3 miles a day to recharge his ankle bracelet for 2 hours each day.  He has no family who can accept him into their home.  I suspect that he will still need supervision at the end of an inpatient rehab stay.  He has no one to provide supervision.  Therefore, recommend SNF placement for rehab.  I have talked to rehab team and to my supervisor who also feel that SNF placement will be needed.  Call me for questions.  #[phone]
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
## 2015-07-26 NOTE — Care Management Note (Signed)
Case Management Note  Patient Details  Name: Ronnie Rodriguez MRN: [id number] Date of Birth: 1991-09-25  Subjective/Objective:         Admitted with right hip fracture, s/p right IM nail             Action/Plan: Informed patient that social work is unable to place him in a facility and that they recommend he contact his Civil Service fast streamer. Patient agreeable to calling.      Expected Discharge Date:                  Expected Discharge Plan:     In-House Referral:  Clinical Social Work  Discharge planning Services  CM Consult  Post Acute Care Choice:    Choice offered to:     DME Arranged:    DME Agency:     HH Arranged:    HH Agency:     Status of Service:  In process, will continue to follow  Medicare Important Message Given:    Date Medicare IM Given:    Medicare IM give by:    Date Additional Medicare IM Given:    Additional Medicare Important Message give by:     If discussed at Long Length of Stay Meetings, dates discussed:    Additional Comments:  Monica Becton, RN 07/26/2015, 3:48 PM
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
## 2015-07-26 NOTE — Progress Notes (Addendum)
   Subjective:  Patient denies syncope, lightheadedness, SOB, CP, dizziness  Objective:   VITALS:   Filed Vitals:   07/25/15 0528 07/25/15 1322 07/25/15 1937 07/26/15 0439  BP: 119/62 129/72 134/64 115/51  Pulse: 83 117 101 90  Temp: 98.2 F (36.8 C) 100 F (37.8 C) 99.4 F (37.4 C) 99.3 F (37.4 C)  TempSrc: Oral Oral    Resp: Height:      Weight:      SpO2: 100% 100% 100% 100%    Neurologically intact Neurovascular intact Sensation intact distally Intact pulses distally Dorsiflexion/Plantar flexion intact Incision: dressing C/D/I and no drainage No cellulitis present Compartment soft   Lab Results  Component Value Date   WBC 6.0 07/26/2015   HGB 7.6* 07/26/2015   HCT 22.7* 07/26/2015   MCV 85.0 07/26/2015   PLT 137* 07/26/2015     Assessment/Plan:  3 Days Post-Op   - Expected postop acute blood loss anemia - will monitor for symptoms - Up with PT/OT - patient having difficulty ambulating with PT - DVT ppx - SCDs, ambulation, aspirin - WBAT operative extremity - Pain control - Discharge planning -CIR pending   Cheral Almas 07/26/2015, 7:54 AM [phone]

## 2015-07-27 LAB — TYPE AND SCREEN
ABO/RH(D): O POS
ANTIBODY SCREEN: NEGATIVE
Unit division: 0

## 2015-07-27 LAB — CBC WITH DIFFERENTIAL/PLATELET
BASOS ABS: 0 10*3/uL (ref 0.0–0.1)
BASOS PCT: 0 %
EOS ABS: 0.1 10*3/uL (ref 0.0–0.7)
EOS PCT: 3 %
HCT: 24.3 % — ABNORMAL LOW (ref 39.0–52.0)
Hemoglobin: 8.3 g/dL — ABNORMAL LOW (ref 13.0–17.0)
Lymphocytes Relative: 28 %
Lymphs Abs: 1.2 10*3/uL (ref 0.7–4.0)
MCH: 29.5 pg (ref 26.0–34.0)
MCHC: 34.2 g/dL (ref 30.0–36.0)
MCV: 86.5 fL (ref 78.0–100.0)
MONO ABS: 0.3 10*3/uL (ref 0.1–1.0)
MONOS PCT: 8 %
NEUTROS ABS: 2.5 10*3/uL (ref 1.7–7.7)
Neutrophils Relative %: 61 %
PLATELETS: 169 10*3/uL (ref 150–400)
RBC: 2.81 MIL/uL — ABNORMAL LOW (ref 4.22–5.81)
RDW: 13.7 % (ref 11.5–15.5)
WBC: 4.1 10*3/uL (ref 4.0–10.5)
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
## 2015-07-27 NOTE — Progress Notes (Signed)
Patient progressing well with PT. Will live at a hotel post discharge. We will treat his radial styloid fracture nonop. F/u 1 week for wound check  N. Glee Arvin, MD [facility] Orthopedics [phone] 5:04 PM

## 2015-07-27 NOTE — Progress Notes (Signed)
Spoke with patent and his girlfriend, patient stated that he had spoken with his parole officer and the officer stated that he could not help with lodging. Patient stated that he and his girlfriend would be able to pay for a motel room for about a week. I informed him that assistance with getting some equipment and medication is available. CM will follow up with him.

## 2015-07-28 NOTE — Progress Notes (Signed)
Subjective: 5 Days Post-Op Procedure(s) (LRB): INTRAMEDULLARY (IM) NAIL FEMORAL (Right) Patient reports pain as mild.  In chair dressed and ready for discharge.   Objective: Vital signs in last 24 hours: Temp:  [98 F (36.7 C)-99.8 F (37.7 C)] 98 F (36.7 C) (10/08 0517) Pulse Rate:  [75-89] 75 (10/08 0517) Resp:  [18] 18 (10/08 0517) BP: (125-133)/(63-77) 125/63 mmHg (10/08 0517) SpO2:  [100 %] 100 % (10/08 0517)  Intake/Output from previous day: 10/07 0701 - 10/08 0700 In: 590 [P.O.:590] Out: 660 [Urine:660] Intake/Output this shift:     Recent Labs  07/26/15 0613 07/27/15 1027  HGB 7.6* 8.3*    Recent Labs  07/26/15 0613 07/27/15 1027  WBC 6.0 4.1  RBC 2.67* 2.81*  HCT 22.7* 24.3*  PLT 137* 169   No results for input(s): NA, K, CL, CO2, BUN, CREATININE, GLUCOSE, CALCIUM in the last 72 hours. No results for input(s): LABPT, INR in the last 72 hours.  AOtimes 3.   Assessment/Plan: 5 Days Post-Op Procedure(s) (LRB): INTRAMEDULLARY (IM) NAIL FEMORAL (Right) Plan : discharge  Kaysin Brock C 07/28/2015, 9:48 AM
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
## 2015-07-28 NOTE — Care Management Note (Addendum)
Case Management Note  Patient Details  Name: Ronnie Rodriguez MRN: [id number] Date of Birth: [birth year]  Subjective/Objective:                    Action/Plan:   Expected Discharge Date:                  Expected Discharge Plan:     In-House Referral:  Clinical Social Work  Discharge planning Services  CM Consult  Post Acute Care Choice:    Choice offered to:     DME Arranged:    DME Agency:     HH Arranged:    [place] Agency:     Status of Service:  In process, will continue to follow  Medicare Important Message Given:    Date Medicare IM Given:    Medicare IM give by:    Date Additional Medicare IM Given:    Additional Medicare Important Message give by:     If discussed at [place] of Stay Meetings, dates discussed:    Additional Comments: met with pt and girlfriend. Pt to be d/c today with the support of his girlfriend. Discussed DME (R platform RW and 3-in-1 BSC). He stated that he only needs the platform RW. Contacted [name] at [place] for DME. [name] will contact pt to discuss charity service. DME delivered.  [name], RN 07/28/2015, 9:37 AM

## 2016-05-03 ENCOUNTER — Emergency Department (HOSPITAL_COMMUNITY)
Admission: EM | Admit: 2016-05-03 | Discharge: 2016-05-03 | Disposition: A | Discharge status: Home / Self Care | Payer: Self-pay | Attending: Emergency Medicine | Admitting: Emergency Medicine

## 2016-05-03 ENCOUNTER — Encounter (HOSPITAL_COMMUNITY): Payer: Self-pay | Admitting: Emergency Medicine

## 2016-05-03 ENCOUNTER — Emergency Department (HOSPITAL_COMMUNITY): Payer: Self-pay

## 2016-05-03 DIAGNOSIS — R079 Chest pain, unspecified: Secondary | ICD-10-CM | POA: Insufficient documentation

## 2016-05-03 DIAGNOSIS — Z7982 Long term (current) use of aspirin: Secondary | ICD-10-CM | POA: Insufficient documentation

## 2016-05-03 LAB — CBC
HCT: 40.8 % (ref 39.0–52.0)
Hemoglobin: 14.1 g/dL (ref 13.0–17.0)
MCH: 29.8 pg (ref 26.0–34.0)
MCHC: 34.6 g/dL (ref 30.0–36.0)
MCV: 86.3 fL (ref 78.0–100.0)
PLATELETS: 260 10*3/uL (ref 150–400)
RBC: 4.73 MIL/uL (ref 4.22–5.81)
RDW: 12.6 % (ref 11.5–15.5)
WBC: 14.9 10*3/uL — ABNORMAL HIGH (ref 4.0–10.5)

## 2016-05-03 LAB — BASIC METABOLIC PANEL
ANION GAP: 8 (ref 5–15)
BUN: 17 mg/dL (ref 6–20)
CO2: 24 mmol/L (ref 22–32)
CREATININE: 1.2 mg/dL (ref 0.61–1.24)
Calcium: 9.6 mg/dL (ref 8.9–10.3)
Chloride: 106 mmol/L (ref 101–111)
GFR calc non Af Amer: >60 mL/min (ref >60)
Glucose, Bld: 92 mg/dL (ref 65–99)
Potassium: 3.6 mmol/L (ref 3.5–5.1)
Sodium: 138 mmol/L (ref 135–145)

## 2016-05-03 LAB — I-STAT TROPONIN, ED: Troponin i, poc: 0.03 ng/mL (ref 0.00–0.08)

## 2016-05-03 MED ORDER — IBUPROFEN 800 MG PO TABS: 800.0000 mg | ORAL_TABLET | Freq: Three times a day (TID) | ORAL | Status: AC

## 2016-05-03 MED ORDER — MORPHINE SULFATE (PF) 4 MG/ML IV SOLN
4.0000 mg | Freq: Once | INTRAVENOUS | Status: AC
  Administered 2016-05-03: 4 mg via INTRAVENOUS
  Filled 2016-05-03: qty 1

## 2016-05-03 NOTE — ED Provider Notes (Signed)
CSN: [id number]     Arrival date & time 05/03/16  1949 History   First MD Initiated Contact with Patient 05/03/16 1951     Chief Complaint  Patient presents with  . Chest Pain     (Consider location/radiation/quality/duration/timing/severity/associated sxs/prior Treatment) HPI Comments: Patient presents to the ED with a chief complaint of chest pain.  He states that this episode of chest pain started at 1:00PM today and has been constant until now.  He rates his pain as a 7/10.  He reports associated SOB and dizziness.  Reports that he has had similar symptoms for the past year.  Was reportedly in an argument with his girlfriend prior to symptom onset.  Patient reports prior heart surgery as a youth for heart murmur.  He doesn't follow with any doctors given insurance status.  He denies any crack cocaine use.  The pain is worsened with inspiration.  The history is provided by the patient. No language interpreter was used.    Past Medical History  Diagnosis Date  . Heart murmur    Past Surgical History  Procedure Laterality Date  . Femur im nail Right 07/23/2015    Procedure: INTRAMEDULLARY (IM) NAIL FEMORAL;  Surgeon: Tarry Kos, MD;  Location: [facility] OR;  Service: Orthopedics;  Laterality: Right;   History reviewed. No pertinent family history. Social History  Substance Use Topics  . Smoking status: Never Smoker   . Smokeless tobacco: None  . Alcohol Use: No    Review of Systems  Respiratory: Positive for shortness of breath.   Cardiovascular: Positive for chest pain.  All other systems reviewed and are negative.     Allergies  Penicillins  Home Medications   Prior to Admission medications   Medication Sig Start Date End Date Taking? Authorizing Provider  aspirin EC 325 MG tablet Take 1 tablet (325 mg total) by mouth 2 (two) times daily. 07/23/15   Naiping Donnelly Stager, MD  oxyCODONE (OXY IR/ROXICODONE) 5 MG immediate release tablet Take 1-3 tablets (5-15 mg total) by mouth every  4 (four) hours as needed. 07/23/15   Tarry Kos, MD  OxyCODONE (OXYCONTIN) 10 mg T12A 12 hr tablet Take 1 tablet (10 mg total) by mouth every 12 (twelve) hours. 07/23/15   Naiping Donnelly Stager, MD   BP 128/79 mmHg  Pulse 85  Temp(Src) 98.4 F (36.9 C) (Oral)  Resp 22  Ht  (1.905 m)  Wt 77.111 kg  BMI 21.25 kg/m2  SpO2 100% Physical Exam  Constitutional: He is oriented to person, place, and time. He appears well-developed and well-nourished.  HENT:  Head: Normocephalic and atraumatic.  Eyes: Conjunctivae and EOM are normal. Pupils are equal, round, and reactive to light. Right eye exhibits no discharge. Left eye exhibits no discharge. No scleral icterus.  Neck: Normal range of motion. Neck supple. No JVD present.  Cardiovascular: Normal rate, regular rhythm and normal heart sounds.  Exam reveals no gallop and no friction rub.   No murmur heard. Pulmonary/Chest: Effort normal and breath sounds normal. No respiratory distress. He has no wheezes. He has no rales. He exhibits no tenderness.  Abdominal: Soft. He exhibits no distension and no mass. There is no tenderness. There is no rebound and no guarding.  Musculoskeletal: Normal range of motion. He exhibits no edema or tenderness.  Neurological: He is alert and oriented to person, place, and time.  Skin: Skin is warm and dry.  Psychiatric: He has a normal mood and affect. His behavior is  normal. Judgment and thought content normal.  Nursing note and vitals reviewed.   ED Course  Procedures (including critical care time) Results for orders placed or performed during the hospital encounter of 05/03/16  Basic metabolic panel  Result Value Ref Range   Sodium 138 135 - 145 mmol/L   Potassium 3.6 3.5 - 5.1 mmol/L   Chloride 106 101 - 111 mmol/L   CO2 24 22 - 32 mmol/L   Glucose, Bld 92 65 - 99 mg/dL   BUN 17 6 - 20 mg/dL   Creatinine, Ser 1.611.20 0.61 - 1.24 mg/dL   Calcium 9.6 8.9 - 09.610.3 mg/dL   GFR calc non Af Amer >60 >60 mL/min   GFR  calc Af Amer >60 >60 mL/min   Anion gap 8 5 - 15  CBC  Result Value Ref Range   WBC 14.9 (H) 4.0 - 10.5 K/uL   RBC 4.73 4.22 - 5.81 MIL/uL   Hemoglobin 14.1 13.0 - 17.0 g/dL   HCT 04.540.8 40.939.0 - 81.152.0 %   MCV 86.3 78.0 - 100.0 fL   MCH 29.8 26.0 - 34.0 pg   MCHC 34.6 30.0 - 36.0 g/dL   RDW 91.412.6 78.211.5 - 95.615.5 %   Platelets 260 150 - 400 K/uL  I-stat troponin, ED  Result Value Ref Range   Troponin i, poc 0.03 0.00 - 0.08 ng/mL   Comment 3           Dg Chest 2 View  05/03/2016  CLINICAL DATA:  25 year old male with chest pain for 1 day. EXAM: CHEST  2 VIEW COMPARISON:  07/23/2015 and prior chest radiographs FINDINGS: The cardiomediastinal silhouette is unremarkable. Median sternotomy wires again noted. There is no evidence of focal airspace disease, pulmonary edema, suspicious pulmonary nodule/mass, pleural effusion, or pneumothorax. No acute bony abnormalities are identified. IMPRESSION: No active cardiopulmonary disease. Electronically Signed   By: Harmon PierJeffrey  Hu M.D.   On: 05/03/2016 20:47    I have personally reviewed and evaluated these images and lab results as part of my medical decision-making.   EKG Interpretation None      MDM   Final diagnoses:  Chest pain, unspecified chest pain type    Patient with CP and SOB.  Hx of heart surgery 2/2 murmur as a child.   PERC negative, low suspicion for ACS, HEART score is 0 .  Patient was in an argument prior to onset.  Consider anxiety or stress.  No fever.  No change with position.  Doubt pericarditis.  Symptoms have been ongoing for the past year.  Recommend PCP follow-up.    Roxy Horsemanobert Tammera Engert, PA-C 05/03/16 2234  Bethann BerkshireJoseph Zammit, MD 05/03/16 [id number]

## 2016-05-03 NOTE — ED Notes (Signed)
Patient verbalized understanding of discharge instructions and denies any further needs or questions at this time. VS stable. Patient ambulatory with steady gait.  

## 2016-05-03 NOTE — ED Notes (Signed)
PER [facility]: [facility] [place] [facility] (pt states he is homeless) c/o central to L sided CP onset about an hour ago - states he has had this for a year and a half it comes and goes. Accompanied by SOB and dizziness. Patient hx heart murmur, no other medical hx or medications. Given 324 ASA by EMS and 400cc NS bolus - decreased HR from ST 116 to 80 NSR. Pt A&O x 4, respirations e/u at this time. Pain 8/10, worse when breathing in.

## 2016-05-03 NOTE — ED Notes (Signed)
Debra, NS provided patient with Mal[place]key sandwich, graham crackers/peanut butter, and soda per PA-C.

## 2016-05-03 NOTE — Discharge Instructions (Signed)
Nonspecific Chest Pain  °Chest pain can be caused by many different conditions. There is always a chance that your pain could be related to something serious, such as a heart attack or a blood clot in your lungs. Chest pain can also be caused by conditions that are not life-threatening. If you have chest pain, it is very important to follow up with your health care provider. °CAUSES  °Chest pain can be caused by: °· Heartburn. °· Pneumonia or bronchitis. °· Anxiety or stress. °· Inflammation around your heart (pericarditis) or lung (pleuritis or pleurisy). °· A blood clot in your lung. °· A collapsed lung (pneumothorax). It can develop suddenly on its own (spontaneous pneumothorax) or from trauma to the chest. °· Shingles infection (varicella-zoster virus). °· Heart attack. °· Damage to the bones, muscles, and cartilage that make up your chest wall. This can include: °¨ Bruised bones due to injury. °¨ Strained muscles or cartilage due to frequent or repeated coughing or overwork. °¨ Fracture to one or more ribs. °¨ Sore cartilage due to inflammation (costochondritis). °RISK FACTORS  °Risk factors for chest pain may include: °· Activities that increase your risk for trauma or injury to your chest. °· Respiratory infections or conditions that cause frequent coughing. °· Medical conditions or overeating that can cause heartburn. °· Heart disease or family history of heart disease. °· Conditions or health behaviors that increase your risk of developing a blood clot. °· Having had chicken pox (varicella zoster). °SIGNS AND SYMPTOMS °Chest pain can feel like: °· Burning or tingling on the surface of your chest or deep in your chest. °· Crushing, pressure, aching, or squeezing pain. °· Dull or sharp pain that is worse when you move, cough, or take a deep breath. °· Pain that is also felt in your back, neck, shoulder, or arm, or pain that spreads to any of these areas. °Your chest pain may come and go, or it may stay  constant. °DIAGNOSIS °Lab tests or other studies may be needed to find the cause of your pain. Your health care provider may have you take a test called an ambulatory ECG (electrocardiogram). An ECG records your heartbeat patterns at the time the test is performed. You may also have other tests, such as: °· Transthoracic echocardiogram (TTE). During echocardiography, sound waves are used to create a picture of all of the heart structures and to look at how blood flows through your heart. °· Transesophageal echocardiogram (TEE). This is a more advanced imaging test that obtains images from inside your body. It allows your health care provider to see your heart in finer detail. °· Cardiac monitoring. This allows your health care provider to monitor your heart rate and rhythm in real time. °· Holter monitor. This is a portable device that records your heartbeat and can help to diagnose abnormal heartbeats. It allows your health care provider to track your heart activity for several days, if needed. °· Stress tests. These can be done through exercise or by taking medicine that makes your heart beat more quickly. °· Blood tests. °· Imaging tests. °TREATMENT  °Your treatment depends on what is causing your chest pain. Treatment may include: °· Medicines. These may include: °¨ Acid blockers for heartburn. °¨ Anti-inflammatory medicine. °¨ Pain medicine for inflammatory conditions. °¨ Antibiotic medicine, if an infection is present. °¨ Medicines to dissolve blood clots. °¨ Medicines to treat coronary artery disease. °· Supportive care for conditions that do not require medicines. This may include: °¨ Resting. °¨ Applying heat   or cold packs to injured areas. °¨ Limiting activities until pain decreases. °HOME CARE INSTRUCTIONS °· If you were prescribed an antibiotic medicine, finish it all even if you start to feel better. °· Avoid any activities that bring on chest pain. °· Do not use any tobacco products, including  cigarettes, chewing tobacco, or electronic cigarettes. If you need help quitting, ask your health care provider. °· Do not drink alcohol. °· Take medicines only as directed by your health care provider. °· Keep all follow-up visits as directed by your health care provider. This is important. This includes any further testing if your chest pain does not go away. °· If heartburn is the cause for your chest pain, you may be told to keep your head raised (elevated) while sleeping. This reduces the chance that acid will go from your stomach into your esophagus. °· Make lifestyle changes as directed by your health care provider. These may include: °¨ Getting regular exercise. Ask your health care provider to suggest some activities that are safe for you. °¨ Eating a heart-healthy diet. A registered dietitian can help you to learn healthy eating options. °¨ Maintaining a healthy weight. °¨ Managing diabetes, if necessary. °¨ Reducing stress. °SEEK MEDICAL CARE IF: °· Your chest pain does not go away after treatment. °· You have a rash with blisters on your chest. °· You have a fever. °SEEK IMMEDIATE MEDICAL CARE IF:  °· Your chest pain is worse. °· You have an increasing cough, or you cough up blood. °· You have severe abdominal pain. °· You have severe weakness. °· You faint. °· You have chills. °· You have sudden, unexplained chest discomfort. °· You have sudden, unexplained discomfort in your arms, back, neck, or jaw. °· You have shortness of breath at any time. °· You suddenly start to sweat, or your skin gets clammy. °· You feel nauseous or you vomit. °· You suddenly feel light-headed or dizzy. °· Your heart begins to beat quickly, or it feels like it is skipping beats. °These symptoms may represent a serious problem that is an emergency. Do not wait to see if the symptoms will go away. Get medical help right away. Call your local emergency services (911 in the [place] Do not drive yourself to the hospital. °  °This  information is not intended to replace advice given to you by your health care provider. Make sure you discuss any questions you have with your health care provider. °  °Document Released: 07/16/2005 Document Revised: 10/27/2014 Document Reviewed: 05/12/2014 °Elsevier Interactive Patient Education ©2016 [employer] ° °
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
## 2016-12-05 IMAGING — DX DG FEMUR 2+V*R*
4 series · 4 of 4 positions shown · non-contrast
Comparison: None.

CLINICAL DATA: Preoperative radiograph for comminuted right femoral
intertrochanteric fracture; assess for additional fracture. Initial
encounter.

EXAM:
RIGHT FEMUR 2 VIEWS

[femur ap (1 of 2)]
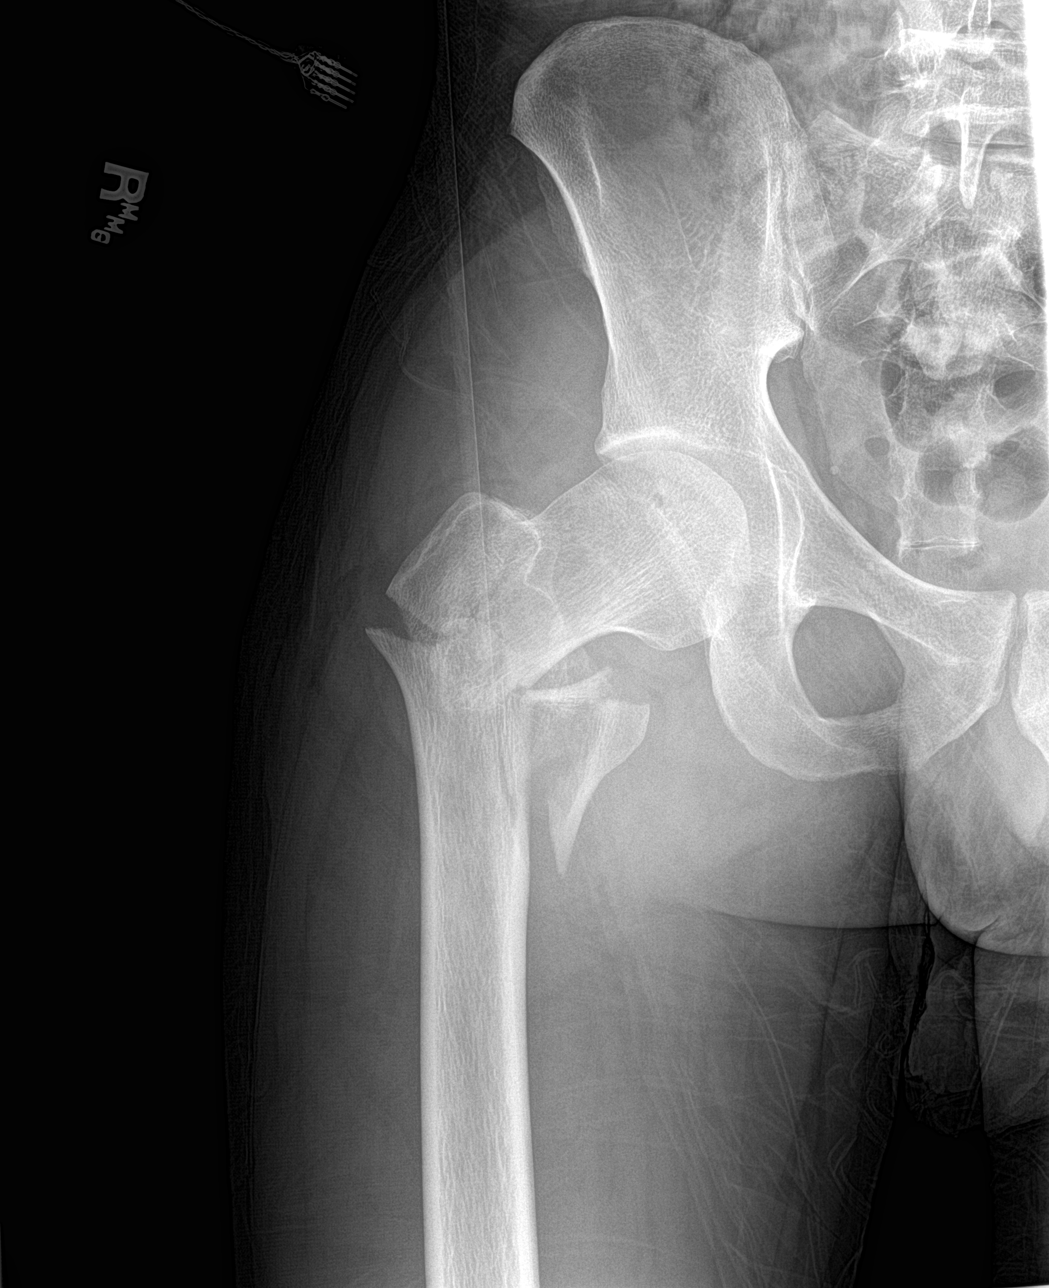

[femur ap (2 of 2)]
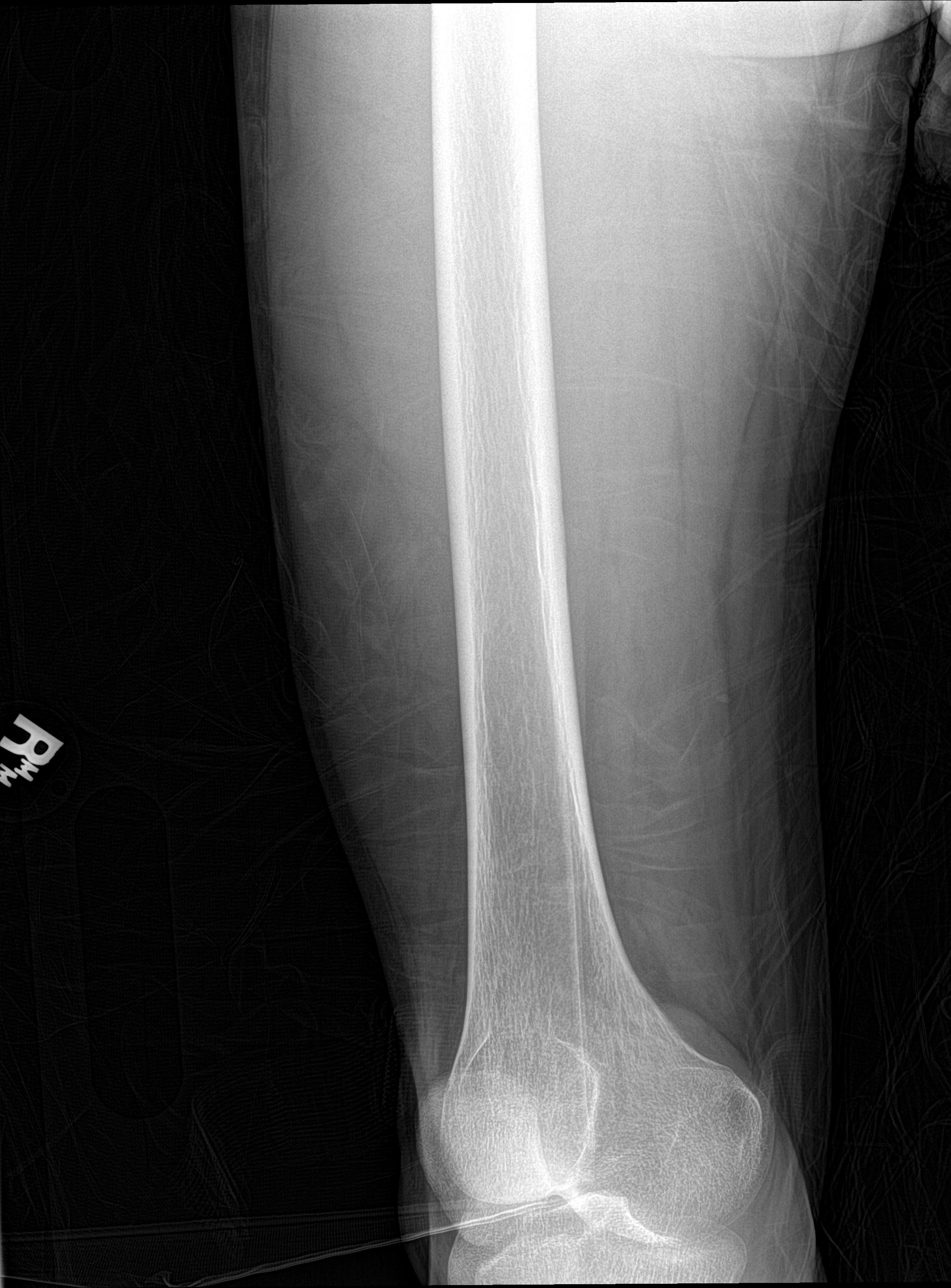

[femur lat (1 of 2)]
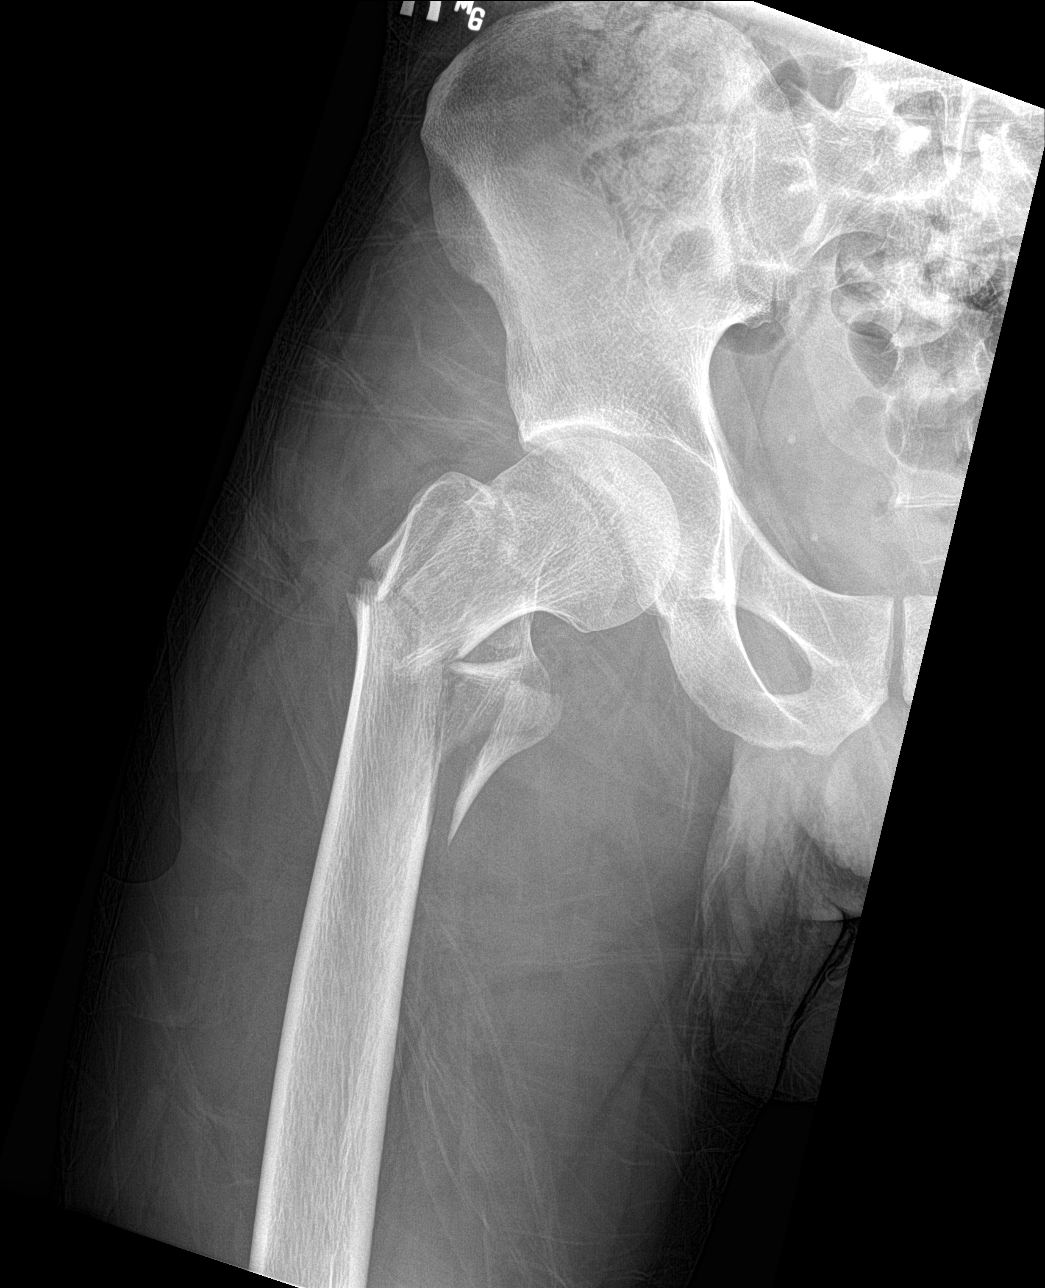

[femur lat (2 of 2)]
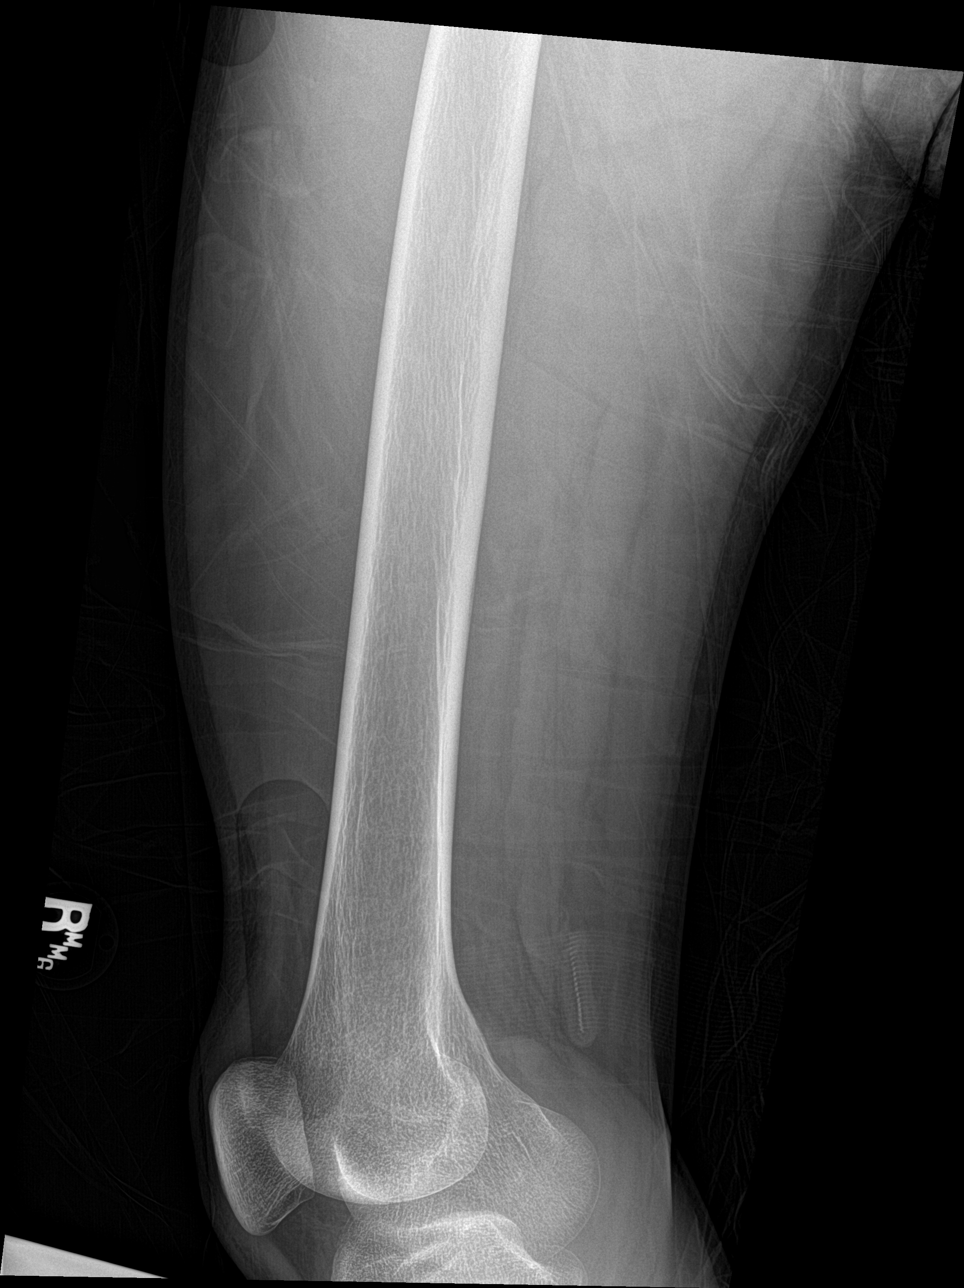

[4 of 4 positions shown; findings below may reference images not displayed]

FINDINGS: There is a comminuted right femoral intertrochanteric fracture, with
displacement of greater and lesser trochanteric fragments. No
additional fractures are seen.

The right femoral head remains seated at the acetabulum. The right
acromioclavicular joint is unremarkable in appearance. The knee
joint is grossly unremarkable.
IMPRESSION: Comminuted right femoral intertrochanteric fracture again noted. No
additional fracture seen.

## 2022-05-10 ENCOUNTER — Ambulatory Visit
Admission: EM | Admit: 2022-05-10 | Discharge: 2022-05-10 | Disposition: A | Discharge status: Home / Self Care | Payer: Self-pay | Attending: Physician Assistant | Admitting: Physician Assistant

## 2022-05-10 DIAGNOSIS — K529 Noninfective gastroenteritis and colitis, unspecified: Secondary | ICD-10-CM

## 2022-05-10 MED ORDER — ONDANSETRON 4 MG PO TBDP: 4.0000 mg | ORAL_TABLET | Freq: Three times a day (TID) | ORAL | 0 refills | Status: AC | PRN

## 2022-05-10 NOTE — ED Triage Notes (Signed)
Pt presents to the office for abdominal pain and diarrhea x 1-2 days.

## 2022-05-10 NOTE — ED Provider Notes (Signed)
[facility]    CSN: [id number] Arrival date & time: 05/10/22  1206      History   Chief Complaint Chief Complaint  Patient presents with   Diarrhea   Abdominal Cramping    HPI Ronnie Rodriguez is a 31 y.o. male.   Patient here today for evaluation of diarrhea and abdominal cramping that started this morning.  He reports that he has had some nausea but no vomiting.  He has not had any fever.  He has not tried any treatment for symptoms.  He does not report any other symptoms.  The history is provided by the patient.  Diarrhea Associated symptoms: abdominal pain   Associated symptoms: no chills, no fever and no vomiting   Abdominal Cramping Associated symptoms include abdominal pain.    Past Medical History:  Diagnosis Date   Heart murmur     Patient Active Problem List   Diagnosis Date Noted   Closed right hip fracture (HCC) 07/23/2015   Hip fracture (HCC) 07/23/2015   Heart murmur     Past Surgical History:  Procedure Laterality Date   FEMUR IM NAIL Right 07/23/2015   Procedure: INTRAMEDULLARY (IM) NAIL FEMORAL;  Surgeon: Tarry Kos, MD;  Location: [facility] OR;  Service: Orthopedics;  Laterality: Right;       Home Medications    Prior to Admission medications   Medication Sig Start Date End Date Taking? Authorizing Provider  ondansetron (ZOFRAN-ODT) 4 MG disintegrating tablet Take 1 tablet (4 mg total) by mouth every 8 (eight) hours as needed. 05/10/22  Yes Tomi Bamberger, PA-C  acetaminophen (TYLENOL) 325 MG tablet Take 650 mg by mouth every 6 (six) hours as needed for mild pain.    [provider]  aspirin EC 325 MG tablet Take 1 tablet (325 mg total) by mouth 2 (two) times daily. Patient not taking: Reported on 05/03/2016 07/23/15   Tarry Kos, MD  ibuprofen (ADVIL,MOTRIN) 800 MG tablet Take 1 tablet (800 mg total) by mouth 3 (three) times daily. 05/03/16   Roxy Horseman, PA-C  oxyCODONE (OXY IR/ROXICODONE) 5 MG immediate release tablet  Take 1-3 tablets (5-15 mg total) by mouth every 4 (four) hours as needed. Patient not taking: Reported on 05/03/2016 07/23/15   Tarry Kos, MD  OxyCODONE (OXYCONTIN) 10 mg T12A 12 hr tablet Take 1 tablet (10 mg total) by mouth every 12 (twelve) hours. Patient not taking: Reported on 05/03/2016 07/23/15   Tarry Kos, MD    Family History History reviewed. No pertinent family history.  Social History Social History   Tobacco Use   Smoking status: Never  Substance Use Topics   Alcohol use: No   Drug use: Yes    Types: Marijuana     Allergies   Penicillins   Review of Systems Review of Systems  Constitutional:  Negative for chills and fever.  Eyes:  Negative for discharge and redness.  Gastrointestinal:  Positive for abdominal pain and diarrhea. Negative for nausea and vomiting.     Physical Exam Triage Vital Signs ED Triage Vitals [05/10/22 1223]  Enc Vitals Group     BP 138/86     Pulse Rate 81     Resp 18     Temp 97.8 F (36.6 C)     Temp Source Oral     SpO2 100 %     Weight      Height      Head Circumference      Peak  Flow      Pain Score      Pain Loc      Pain Edu?      Excl. in GC?    No data found.  Updated Vital Signs BP 138/86 (BP Location: Left Arm)   Pulse 81   Temp 97.8 F (36.6 C) (Oral)   Resp 18   SpO2 100%      Physical Exam Vitals and nursing note reviewed.  Constitutional:      General: He is not in acute distress.    Appearance: Normal appearance. He is not ill-appearing.  HENT:     Head: Normocephalic and atraumatic.  Eyes:     Conjunctiva/sclera: Conjunctivae normal.  Cardiovascular:     Rate and Rhythm: Normal rate and regular rhythm.     Heart sounds: Normal heart sounds. No murmur heard. Pulmonary:     Effort: Pulmonary effort is normal. No respiratory distress.     Breath sounds: Normal breath sounds. No wheezing, rhonchi or rales.  Abdominal:     General: Abdomen is flat. Bowel sounds are normal. There is no  distension.     Palpations: Abdomen is soft.     Tenderness: There is no abdominal tenderness. There is no guarding or rebound.  Skin:    General: Skin is warm and dry.  Neurological:     Mental Status: He is alert.  Psychiatric:        Mood and Affect: Mood normal.        Behavior: Behavior normal.      UC Treatments / Results  Labs (all labs ordered are listed, but only abnormal results are displayed) Labs Reviewed - No data to display  EKG   Radiology No results found.  Procedures Procedures (including critical care time)  Medications Ordered in UC Medications - No data to display  Initial Impression / Assessment and Plan / UC Course  I have reviewed the triage vital signs and the nursing notes.  Pertinent labs & imaging results that were available during my care of the patient were reviewed by me and considered in my medical decision making (see chart for details).    Suspect likely viral etiology of symptoms.  Recommended symptomatic treatment, increase fluids with electrolyte replacement and Zofran for nausea.  Encouraged follow-up if symptoms fail to improve or worsen.  Final Clinical Impressions(s) / UC Diagnoses   Final diagnoses:  Noninfectious gastroenteritis, unspecified type   Discharge Instructions   None    ED Prescriptions     Medication Sig Dispense Auth. Provider   ondansetron (ZOFRAN-ODT) 4 MG disintegrating tablet Take 1 tablet (4 mg total) by mouth every 8 (eight) hours as needed. 20 tablet Tomi Bamberger, PA-C      PDMP not reviewed this encounter.   Tomi Bamberger, PA-C 05/10/22 1329
